# Patient Record
Sex: Female | Born: 1985 | Race: Black or African American | Hispanic: No | Marital: Single | Smoking: Current every day smoker
Health system: Southern US, Community
[De-identification: ages and names within clinical notes are randomized; demographics above are authoritative.]

---
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
## 2006-08-12 ENCOUNTER — Emergency Department: Payer: Self-pay | Admitting: General Practice

## 2008-05-06 ENCOUNTER — Inpatient Hospital Stay: Payer: Self-pay | Admitting: Obstetrics and Gynecology

## 2008-07-20 ENCOUNTER — Emergency Department: Payer: Self-pay | Admitting: Emergency Medicine

## 2008-08-23 ENCOUNTER — Emergency Department: Payer: Self-pay | Admitting: Emergency Medicine

## 2008-08-26 ENCOUNTER — Ambulatory Visit: Payer: Self-pay | Admitting: Emergency Medicine

## 2009-08-08 ENCOUNTER — Encounter: Payer: Self-pay | Admitting: Maternal & Fetal Medicine

## 2009-10-03 ENCOUNTER — Encounter: Payer: Self-pay | Admitting: Obstetrics and Gynecology

## 2009-11-07 ENCOUNTER — Encounter: Payer: Self-pay | Admitting: Maternal and Fetal Medicine

## 2009-12-19 ENCOUNTER — Encounter: Payer: Self-pay | Admitting: Obstetrics and Gynecology

## 2009-12-22 ENCOUNTER — Encounter: Payer: Self-pay | Admitting: Maternal & Fetal Medicine

## 2009-12-26 ENCOUNTER — Observation Stay: Payer: Self-pay | Admitting: Obstetrics and Gynecology

## 2009-12-29 ENCOUNTER — Encounter: Payer: Self-pay | Admitting: Obstetrics and Gynecology

## 2010-01-01 ENCOUNTER — Inpatient Hospital Stay: Payer: Self-pay | Admitting: Obstetrics and Gynecology

## 2010-01-02 ENCOUNTER — Encounter: Payer: Self-pay | Admitting: Maternal & Fetal Medicine

## 2010-01-12 ENCOUNTER — Encounter: Payer: Self-pay | Admitting: Obstetrics and Gynecology

## 2016-11-14 ENCOUNTER — Emergency Department: Payer: PRIVATE HEALTH INSURANCE

## 2016-11-14 ENCOUNTER — Emergency Department
Admission: EM | Admit: 2016-11-14 | Discharge: 2016-11-14 | Disposition: A | Discharge status: Home / Self Care | Payer: PRIVATE HEALTH INSURANCE | Attending: Emergency Medicine | Admitting: Emergency Medicine

## 2016-11-14 ENCOUNTER — Encounter: Payer: Self-pay | Admitting: Medical Oncology

## 2016-11-14 DIAGNOSIS — R300 Dysuria: Secondary | ICD-10-CM

## 2016-11-14 DIAGNOSIS — M549 Dorsalgia, unspecified: Secondary | ICD-10-CM | POA: Diagnosis present

## 2016-11-14 DIAGNOSIS — R319 Hematuria, unspecified: Secondary | ICD-10-CM | POA: Diagnosis not present

## 2016-11-14 DIAGNOSIS — N10 Acute tubulo-interstitial nephritis: Secondary | ICD-10-CM | POA: Insufficient documentation

## 2016-11-14 LAB — URINALYSIS, COMPLETE (UACMP) WITH MICROSCOPIC
Bilirubin Urine: NEGATIVE
GLUCOSE, UA: NEGATIVE mg/dL
Ketones, ur: NEGATIVE mg/dL
NITRITE: NEGATIVE
PROTEIN: NEGATIVE mg/dL
Specific Gravity, Urine: 1.008 (ref 1.005–1.030)
pH: 7 (ref 5.0–8.0)

## 2016-11-14 LAB — COMPREHENSIVE METABOLIC PANEL
ALT: 12 U/L — ABNORMAL LOW (ref 14–54)
AST: 16 U/L (ref 15–41)
Albumin: 3.4 g/dL — ABNORMAL LOW (ref 3.5–5.0)
Alkaline Phosphatase: 58 U/L (ref 38–126)
Anion gap: 6 (ref 5–15)
BILIRUBIN TOTAL: 0.5 mg/dL (ref 0.3–1.2)
BUN: 7 mg/dL (ref 6–20)
CO2: 24 mmol/L (ref 22–32)
CREATININE: 0.65 mg/dL (ref 0.44–1.00)
Calcium: 8.6 mg/dL — ABNORMAL LOW (ref 8.9–10.3)
Chloride: 104 mmol/L (ref 101–111)
GFR calc Af Amer: >60 mL/min (ref >60)
Glucose, Bld: 97 mg/dL (ref 65–99)
POTASSIUM: 3.3 mmol/L — AB (ref 3.5–5.1)
Sodium: 134 mmol/L — ABNORMAL LOW (ref 135–145)
TOTAL PROTEIN: 7.2 g/dL (ref 6.5–8.1)

## 2016-11-14 LAB — CBC WITH DIFFERENTIAL/PLATELET
BASOS ABS: 0 10*3/uL (ref 0–0.1)
BASOS PCT: 0 %
EOS PCT: 0 %
Eosinophils Absolute: 0 10*3/uL (ref 0–0.7)
HCT: 31.1 % — ABNORMAL LOW (ref 35.0–47.0)
Hemoglobin: 10.9 g/dL — ABNORMAL LOW (ref 12.0–16.0)
Lymphocytes Relative: 12 %
Lymphs Abs: 1.9 10*3/uL (ref 1.0–3.6)
MCH: 32.7 pg (ref 26.0–34.0)
MCHC: 35.2 g/dL (ref 32.0–36.0)
MCV: 92.9 fL (ref 80.0–100.0)
Monocytes Absolute: 2 10*3/uL — ABNORMAL HIGH (ref 0.2–0.9)
Monocytes Relative: 13 %
Neutro Abs: 12.3 10*3/uL — ABNORMAL HIGH (ref 1.4–6.5)
Neutrophils Relative %: 75 %
Platelets: 226 10*3/uL (ref 150–440)
RBC: 3.34 MIL/uL — AB (ref 3.80–5.20)
RDW: 12.6 % (ref 11.5–14.5)
WBC: 16.3 10*3/uL — AB (ref 3.6–11.0)

## 2016-11-14 LAB — POCT PREGNANCY, URINE: Preg Test, Ur: NEGATIVE

## 2016-11-14 LAB — LACTIC ACID, PLASMA: Lactic Acid, Venous: 0.6 mmol/L (ref 0.5–1.9)

## 2016-11-14 MED ORDER — CIPROFLOXACIN HCL 500 MG PO TABS: 500.0000 mg | ORAL_TABLET | Freq: Two times a day (BID) | ORAL | 0 refills | Status: AC

## 2016-11-14 MED ORDER — ACETAMINOPHEN 500 MG PO TABS
1000.0000 mg | ORAL_TABLET | Freq: Once | ORAL | Status: AC
  Administered 2016-11-14: 1000 mg via ORAL
  Filled 2016-11-14: qty 2

## 2016-11-14 MED ORDER — CEFTRIAXONE SODIUM IN DEXTROSE 20 MG/ML IV SOLN
1.0000 g | Freq: Once | INTRAVENOUS | Status: AC
  Administered 2016-11-14: 1 g via INTRAVENOUS
  Filled 2016-11-14: qty 50

## 2016-11-14 MED ORDER — SODIUM CHLORIDE 0.9 % IV BOLUS (SEPSIS)
1000.0000 mL | Freq: Once | INTRAVENOUS | Status: AC
  Administered 2016-11-14: 1000 mL via INTRAVENOUS

## 2016-11-14 MED ORDER — HYDROCODONE-ACETAMINOPHEN 5-325 MG PO TABS: ORAL_TABLET | ORAL | 0 refills | Status: AC

## 2016-11-14 NOTE — ED Triage Notes (Addendum)
Pt reports lower back pain, body chills and aches that began Sunday. Pt also reports pain with urinating, feels similar to uti.

## 2016-11-14 NOTE — ED Notes (Signed)
A/o. Pain x 3 days. Denies injury

## 2016-11-14 NOTE — Discharge Instructions (Signed)
Begin taking Cipro twice a day for the next 10 days. Norco if needed for severe pain. Call and make an appointment with Dr. Apolinar JunesBrandon concerning findings seen on your CT scan of the kidneys today. Return to the emergency room if any nausea, vomiting or inability to take antibiotics. Continue to take Tylenol if needed for fever. Increase fluids.

## 2016-11-14 NOTE — ED Notes (Signed)
Pt ambulatory to and from bathroom with steady gait noted

## 2016-11-14 NOTE — ED Provider Notes (Signed)
[facility] Emergency Department Provider Note ____________________________________________  Time seen: 12:14 PM  I have reviewed the triage vital signs and the nursing notes.  HISTORY  Chief Complaint  Back Pain; Generalized Body Aches; and Chills   HPI Courtney Glass is a 31 y.o. female presents to the emergency room with complaint of back pain, body aches, and chills. Patient states that she has had some symptoms of a urinary tract infection and this feels similar to her past UTIs. She states that she has not had a UTI in many years. She denies any nausea or vomiting.Patient reports pain with urination especially to the right groin area. Patient states she was not aware that she had a fever until she arrived in triage. Currently she rates her pain as an 8 out of 10.    History reviewed. No pertinent past medical history.  There are no active problems to display for this patient.   No past surgical history on file.  Prior to Admission medications   Medication Sig Start Date End Date Taking? Authorizing Provider  ciprofloxacin (CIPRO) 500 MG tablet Take 1 tablet (500 mg total) by mouth 2 (two) times daily. 11/14/16   Tommi Rumps, PA-C  HYDROcodone-acetaminophen (NORCO/VICODIN) 5-325 MG tablet 1 tab q 4-6 h prn pain 11/14/16   Tommi Rumps, PA-C    Allergies Patient has no known allergies.  No family history on file.  Social History Social History  Substance Use Topics  . Smoking status: Not on file  . Smokeless tobacco: Not on file  . Alcohol use Not on file    Review of Systems  Constitutional: Positive for chills. Cardiovascular: Negative for chest pain. Respiratory: Negative for shortness of breath. Gastrointestinal: Negative for abdominal pain.   Genitourinary: Positive for dysuria Musculoskeletal: Positive for back pain. Positive for body aches. Skin: Negative for rash. Neurological: Negative for headaches, focal weakness  or numbness. ____________________________________________  PHYSICAL EXAM:  VITAL SIGNS: ED Triage Vitals  Enc Vitals Group     BP 11/14/16 1152 129/85     Pulse Rate 11/14/16 1152 (!) 102     Resp 11/14/16 1152 16     Temp 11/14/16 1152 (!) 102.2 F (39 C)     Temp Source 11/14/16 1152 Oral     SpO2 11/14/16 1152 100 %     Weight --      Height --      Head Circumference --      Peak Flow --      Pain Score 11/14/16 1148 8     Pain Loc --      Pain Edu? --      Excl. in GC? --     Constitutional: Alert and oriented. Well appearing and in no distress. Head: Normocephalic and atraumatic. Neck: Supple.  Cardiovascular: Normal rate, regular rhythm. Normal distal pulses. Respiratory: Normal respiratory effort. No wheezes/rales/rhonchi. Gastrointestinal: Soft and nontender. No distention. Mildly positive CVA tenderness. Musculoskeletal: On examination back there is no gross deformity and no tenderness on palpation of the thoracic or lumbar spine. Patient moves upper and lower extremities without any difficulty. Neurologic:   Normal speech and language. No gross focal neurologic deficits are appreciated. Skin:  Skin is warm, dry and intact. No rash noted. Psychiatric: Mood and affect are normal. Patient exhibits appropriate insight and judgment. ____________________________________________   LABS (pertinent positives/negatives) WBC 16.3, hemoglobin 10.9, hematocrit 31.1 Urinalysis showed trace WBCs with RBCs too numerous to count. ____________________________________________ __________________________________________  RADIOLOGY IMPRESSION:  Right renal findings seen with pyelonephritis. There is also a 23 mm  right renal mass which is high density and may reflect a hemorrhagic  cyst. Recommend imaging follow-up after treatment, could first  attempt sonography. No hydronephrosis or urolithiasis.    INITIAL IMPRESSION / ASSESSMENT AND PLAN / ED COURSE  Patient was given  bolus normal saline 1 L along with Rocephin 1 g IV. Patient continued to deny any nausea or vomiting. Patient rested well while in the emergency department. She is comfortable with the plan of going home on antibiotics. She is to follow-up with the urologist and contact information was given for follow-up of her CT scan. Patient is aware that if she develops nausea and vomiting or inability to take antibiotics she is return to the emergency room immediately. She will also continue taking Tylenol as needed for fever. Patient's temp was 98.7 prior to discharge and patient was ambulatory without any difficulty or assistance.    ____________________________________________  FINAL CLINICAL IMPRESSION(S) / ED DIAGNOSES  Final diagnoses:  Dysuria  Hematuria  Pyelonephritis, acute     Jerel Sardina L, PA-C 05Tommi Rumps/23/18 1555    Sharman CheekStafford, Phillip, MD 11/15/16 [phone]

## 2016-11-16 LAB — URINE CULTURE: Special Requests: NORMAL

## 2020-12-09 ENCOUNTER — Other Ambulatory Visit: Payer: Self-pay

## 2020-12-09 ENCOUNTER — Encounter (HOSPITAL_COMMUNITY): Payer: Self-pay | Admitting: Emergency Medicine

## 2020-12-09 ENCOUNTER — Emergency Department (HOSPITAL_COMMUNITY): Payer: PRIVATE HEALTH INSURANCE

## 2020-12-09 ENCOUNTER — Emergency Department (HOSPITAL_COMMUNITY)
Admission: EM | Admit: 2020-12-09 | Discharge: 2020-12-10 | Disposition: A | Discharge status: Home / Self Care | Payer: PRIVATE HEALTH INSURANCE | Attending: Physician Assistant | Admitting: Physician Assistant

## 2020-12-09 DIAGNOSIS — Z5321 Procedure and treatment not carried out due to patient leaving prior to being seen by health care provider: Secondary | ICD-10-CM | POA: Diagnosis not present

## 2020-12-09 DIAGNOSIS — U071 COVID-19: Secondary | ICD-10-CM | POA: Diagnosis not present

## 2020-12-09 DIAGNOSIS — R0602 Shortness of breath: Secondary | ICD-10-CM | POA: Diagnosis present

## 2020-12-09 LAB — SARS CORONAVIRUS 2 (TAT 6-24 HRS): SARS Coronavirus 2: POSITIVE — AB

## 2020-12-09 MED ORDER — ACETAMINOPHEN 325 MG PO TABS
650.0000 mg | ORAL_TABLET | Freq: Once | ORAL | Status: AC
  Administered 2020-12-09: 13:00:00 650 mg via ORAL
  Filled 2020-12-09: qty 2

## 2020-12-09 NOTE — ED Triage Notes (Signed)
Pt reports feeling generalized weakness that started yesterday that has gotten worse. Pt reports taking home Covid test that resulted positive. Pt also reports chills and loss of taste.

## 2020-12-09 NOTE — ED Notes (Signed)
Pt LWBS, stickers and armband handed to staff.

## 2020-12-09 NOTE — ED Provider Notes (Signed)
Emergency Medicine Provider Triage Evaluation Note  Courtney Glass , a 35 y.o. female  was evaluated in triage.  Pt complains of covid.  She had a positive test at home.  Her friend was sick recently.  She reports body pain and fatigue along with shortness of breath  Review of Systems  Positive: Myalgias Negative: Fever  Physical Exam  BP (!) 134/99 (BP Location: Left Leg)   Pulse 96   Temp 99.8 F (37.7 C) (Oral)   Resp 20   Ht 5\' 3"  (1.6 m)   Wt 79.4 kg   LMP 11/09/2020   SpO2 97%   BMI 31.00 kg/m  Gen:   Awake, no distress   Resp:  Normal effort  MSK:   Moves extremities without difficulty  Other:  Speech is normal.   Medical Decision Making  Medically screening exam initiated at 12:26 PM.  Appropriate orders placed.  JOZEY JANCO was informed that the remainder of the evaluation will be completed by another provider, this initial triage assessment does not replace that evaluation, and the importance of remaining in the ED until their evaluation is complete.     Becky Sax, PA-C 12/09/20 1233    12/11/20, MD 12/09/20 [phone]

## 2021-09-23 ENCOUNTER — Emergency Department (HOSPITAL_BASED_OUTPATIENT_CLINIC_OR_DEPARTMENT_OTHER)
Admission: EM | Admit: 2021-09-23 | Discharge: 2021-09-23 | Disposition: A | Discharge status: Home / Self Care | Payer: PRIVATE HEALTH INSURANCE | Attending: Emergency Medicine | Admitting: Emergency Medicine

## 2021-09-23 ENCOUNTER — Emergency Department (HOSPITAL_BASED_OUTPATIENT_CLINIC_OR_DEPARTMENT_OTHER): Payer: PRIVATE HEALTH INSURANCE

## 2021-09-23 ENCOUNTER — Other Ambulatory Visit: Payer: Self-pay

## 2021-09-23 ENCOUNTER — Encounter (HOSPITAL_BASED_OUTPATIENT_CLINIC_OR_DEPARTMENT_OTHER): Payer: Self-pay | Admitting: Emergency Medicine

## 2021-09-23 DIAGNOSIS — N10 Acute tubulo-interstitial nephritis: Secondary | ICD-10-CM | POA: Insufficient documentation

## 2021-09-23 DIAGNOSIS — N12 Tubulo-interstitial nephritis, not specified as acute or chronic: Secondary | ICD-10-CM | POA: Diagnosis not present

## 2021-09-23 DIAGNOSIS — R109 Unspecified abdominal pain: Secondary | ICD-10-CM | POA: Diagnosis present

## 2021-09-23 LAB — URINALYSIS, ROUTINE W REFLEX MICROSCOPIC
Bilirubin Urine: NEGATIVE
Glucose, UA: NEGATIVE mg/dL
Hgb urine dipstick: NEGATIVE
Ketones, ur: 15 mg/dL — AB
Nitrite: NEGATIVE
Specific Gravity, Urine: 1.01 (ref 1.005–1.030)
pH: 6.5 (ref 5.0–8.0)

## 2021-09-23 LAB — CBC
HCT: 37.7 % (ref 36.0–46.0)
Hemoglobin: 12.9 g/dL (ref 12.0–15.0)
MCH: 33.1 pg (ref 26.0–34.0)
MCHC: 34.2 g/dL (ref 30.0–36.0)
MCV: 96.7 fL (ref 80.0–100.0)
Platelets: 222 10*3/uL (ref 150–400)
RBC: 3.9 MIL/uL (ref 3.87–5.11)
RDW: 12.7 % (ref 11.5–15.5)
WBC: 18.6 10*3/uL — ABNORMAL HIGH (ref 4.0–10.5)
nRBC: 0 % (ref 0.0–0.2)

## 2021-09-23 LAB — HEPATIC FUNCTION PANEL
ALT: 13 U/L (ref 0–44)
AST: 13 U/L — ABNORMAL LOW (ref 15–41)
Albumin: 4.3 g/dL (ref 3.5–5.0)
Alkaline Phosphatase: 71 U/L (ref 38–126)
Bilirubin, Direct: 0.3 mg/dL — ABNORMAL HIGH (ref 0.0–0.2)
Indirect Bilirubin: 0.6 mg/dL (ref 0.3–0.9)
Total Bilirubin: 0.9 mg/dL (ref 0.3–1.2)
Total Protein: 8.2 g/dL — ABNORMAL HIGH (ref 6.5–8.1)

## 2021-09-23 LAB — BASIC METABOLIC PANEL
Anion gap: 11 (ref 5–15)
BUN: 11 mg/dL (ref 6–20)
CO2: 22 mmol/L (ref 22–32)
Calcium: 9.5 mg/dL (ref 8.9–10.3)
Chloride: 100 mmol/L (ref 98–111)
Creatinine, Ser: 0.85 mg/dL (ref 0.44–1.00)
GFR, Estimated: >60 mL/min (ref >60)
Glucose, Bld: 122 mg/dL — ABNORMAL HIGH (ref 70–99)
Potassium: 3.4 mmol/L — ABNORMAL LOW (ref 3.5–5.1)
Sodium: 133 mmol/L — ABNORMAL LOW (ref 135–145)

## 2021-09-23 LAB — LIPASE, BLOOD: Lipase: <10 U/L — ABNORMAL LOW (ref 11–51)

## 2021-09-23 LAB — PREGNANCY, URINE: Preg Test, Ur: NEGATIVE

## 2021-09-23 MED ORDER — IOHEXOL 300 MG/ML  SOLN
100.0000 mL | Freq: Once | INTRAMUSCULAR | Status: AC | PRN
  Administered 2021-09-23: 100 mL via INTRAVENOUS

## 2021-09-23 MED ORDER — SODIUM CHLORIDE 0.9 % IV SOLN
1.0000 g | Freq: Once | INTRAVENOUS | Status: AC
  Administered 2021-09-23: 1 g via INTRAVENOUS
  Filled 2021-09-23: qty 10

## 2021-09-23 MED ORDER — FENTANYL CITRATE PF 50 MCG/ML IJ SOSY
50.0000 ug | PREFILLED_SYRINGE | Freq: Once | INTRAMUSCULAR | Status: AC
  Administered 2021-09-23: 50 ug via INTRAVENOUS
  Filled 2021-09-23: qty 1

## 2021-09-23 MED ORDER — SULFAMETHOXAZOLE-TRIMETHOPRIM 800-160 MG PO TABS: 1.0000 | ORAL_TABLET | Freq: Two times a day (BID) | ORAL | 0 refills | Status: AC

## 2021-09-23 NOTE — Discharge Instructions (Addendum)
You were diagnosed with a kidney infection today. I have prescribed an antibiotic. Please complete the entire prescription. Return to the emergency department if you become unable to tolerate oral liquids or if pain becomes intolerable ?

## 2021-09-23 NOTE — ED Provider Notes (Signed)
?[facility] EMERGENCY DEPT ?Provider Note ? ? ?CSN: [id number] ?Arrival date & time: 09/23/21  1303 ? ?  ? ?History ? ?Chief Complaint  ?Patient presents with  ? Flank Pain  ? ? ?Courtney Glass is a 36 y.o. female.  Patient complains of 2 days of abdominal pain with nausea and occasional vomiting.  Patient states that on Thursday she had a intermittent cheese biscuit aversion was began to have severe pain.  Patient does report fevers at home.  Severe pain.  Denies dysuria or hematuria.  Denies diarrhea or constipation.  Patient states she has history of previous kidney infection and symptoms do feel similar at this time ? ?HPI ? ?  ? ?Home Medications ?Prior to Admission medications   ?Medication Sig Start Date End Date Taking? Authorizing Provider  ?sulfamethoxazole-trimethoprim (BACTRIM DS) 800-160 MG tablet Take 1 tablet by mouth 2 (two) times daily for 7 days. 09/23/21 09/30/21 Yes Darrick Grinder, PA-C  ?ciprofloxacin (CIPRO) 500 MG tablet Take 1 tablet (500 mg total) by mouth 2 (two) times daily. 11/14/16   Tommi Rumps, PA-C  ?HYDROcodone-acetaminophen (NORCO/VICODIN) 5-325 MG tablet 1 tab q 4-6 h prn pain 11/14/16   Tommi Rumps, PA-C  ?   ? ?Allergies    ?Patient has no known allergies.   ? ?Review of Systems   ?Review of Systems  ?Constitutional:  Positive for chills and fever.  ?Respiratory:  Negative for shortness of breath.   ?Cardiovascular:  Negative for chest pain.  ?Gastrointestinal:  Positive for abdominal pain, nausea and vomiting. Negative for constipation and diarrhea.  ?Genitourinary:  Positive for flank pain. Negative for dysuria and hematuria.  ?Skin:  Negative for color change and pallor.  ? ?Physical Exam ?Updated Vital Signs ?BP (!) 134/93 (BP Location: Right Arm)   Pulse 99   Temp 98.7 ?F (37.1 ?C) (Oral)   Resp 16   Ht 5\' 3"  (1.6 m)   Wt 79.4 kg   LMP 09/09/2021 (Approximate)   SpO2 99%   BMI 31.01 kg/m?  ?Physical Exam ?Vitals and nursing note reviewed.   ?Constitutional:   ?   General: She is not in acute distress. ?HENT:  ?   Head: Normocephalic and atraumatic.  ?Eyes:  ?   Conjunctiva/sclera: Conjunctivae normal.  ?Cardiovascular:  ?   Rate and Rhythm: Normal rate and regular rhythm.  ?   Pulses: Normal pulses.  ?   Heart sounds: Normal heart sounds.  ?Pulmonary:  ?   Effort: Pulmonary effort is normal.  ?   Breath sounds: Normal breath sounds.  ?Abdominal:  ?   General: There is no distension.  ?   Palpations: Abdomen is soft.  ?   Tenderness: There is abdominal tenderness (Severe in right upper quadrant and epigastric region). There is right CVA tenderness (Minimal). There is no guarding.  ?Musculoskeletal:  ?   Cervical back: Normal range of motion.  ?Skin: ?   General: Skin is warm and dry.  ?   Coloration: Skin is not jaundiced or pale.  ?Neurological:  ?   Mental Status: She is alert.  ? ? ?ED Results / Procedures / Treatments   ?Labs ?(all labs ordered are listed, but only abnormal results are displayed) ?Labs Reviewed  ?URINALYSIS, ROUTINE W REFLEX MICROSCOPIC - Abnormal; Notable for the following components:  ?    Result Value  ? Ketones, ur 15 (*)   ? Protein, ur TRACE (*)   ? Leukocytes,Ua TRACE (*)   ? Bacteria,  UA FEW (*)   ? All other components within normal limits  ?BASIC METABOLIC PANEL - Abnormal; Notable for the following components:  ? Sodium 133 (*)   ? Potassium 3.4 (*)   ? Glucose, Bld 122 (*)   ? All other components within normal limits  ?CBC - Abnormal; Notable for the following components:  ? WBC 18.6 (*)   ? All other components within normal limits  ?LIPASE, BLOOD - Abnormal; Notable for the following components:  ? Lipase <10 (*)   ? All other components within normal limits  ?HEPATIC FUNCTION PANEL - Abnormal; Notable for the following components:  ? Total Protein 8.2 (*)   ? AST 13 (*)   ? Bilirubin, Direct 0.3 (*)   ? All other components within normal limits  ?PREGNANCY, URINE  ? ? ?EKG ?None ? ?Radiology ?CT Abdomen Pelvis W  Contrast ? ?Result Date: 09/23/2021 ?CLINICAL DATA:  Flank pain, right upper quadrant pain, epigastric pain. EXAM: CT ABDOMEN AND PELVIS WITH CONTRAST TECHNIQUE: Multidetector CT imaging of the abdomen and pelvis was performed using the standard protocol following bolus administration of intravenous contrast. RADIATION DOSE REDUCTION: This exam was performed according to the departmental dose-optimization program which includes automated exposure control, adjustment of the mA and/or kV according to patient size and/or use of iterative reconstruction technique. CONTRAST:  100mL OMNIPAQUE IOHEXOL 300 MG/ML  SOLN COMPARISON:  CT renal stone protocol 11/04/2016 FINDINGS: Lower chest: No acute abnormality. Hepatobiliary: Several small calcifications are scattered throughout the liver, unchanged compared to 11/14/2016 and likely representing sequela of granulomatous disease. No focal liver lesion identified. No gallstones, gallbladder wall thickening, or bile duct dilatation. Pancreas: Unremarkable. No pancreatic ductal dilatation or surrounding inflammatory changes. Spleen: Normal size. Scattered small calcifications throughout the spleen, similar to prior exam and likely representing sequela of granulomatous disease. Adrenals/Urinary Tract: Adrenal glands are unremarkable. Focal hypoenhancement at the upper pole of the right kidney with mild adjacent fat stranding (series 2, image 29). Left kidney is normal. No stone in either renal collecting system or in the urinary bladder. No hydronephrosis. Minimally distended urinary bladder is unremarkable. Stomach/Bowel: Stomach is within normal limits. Appendix appears normal. No evidence of bowel wall thickening, distention, or inflammatory changes. Vascular/Lymphatic: No significant vascular findings. Enlarged retrocaval node measuring 1 cm short axis (series 2, image 27). No other enlarged lymph nodes in the abdomen or pelvis. Reproductive: Uterus and bilateral adnexa are  unremarkable. Other: No abdominal wall hernia or abnormality. No abdominopelvic ascites. Musculoskeletal: No acute or significant osseous findings. IMPRESSION: 1. Focal pyelonephritis in the upper pole of the right kidney. No perinephric fluid collection. 2. No nephrolithiasis or hydronephrosis in either kidney. 3. Enlarged retrocaval node is likely reactive given adjacent right pyelonephritis. 4. Sequela of prior granulomatous disease with small calcified granulomas in the liver and spleen. Electronically Signed   By: Sherron AlesLaura  Parra M.D.   On: 09/23/2021 17:02   ? ?Procedures ?Procedures  ? ? ?Medications Ordered in ED ?Medications  ?cefTRIAXone (ROCEPHIN) 1 g in sodium chloride 0.9 % 100 mL IVPB (has no administration in time range)  ?fentaNYL (SUBLIMAZE) injection 50 mcg (50 mcg Intravenous Given 09/23/21 1629)  ?iohexol (OMNIPAQUE) 300 MG/ML solution 100 mL (100 mLs Intravenous Contrast Given 09/23/21 1636)  ? ? ?ED Course/ Medical Decision Making/ A&P ?  ?                        ?Medical Decision Making ?Amount and/or Complexity of  Data Reviewed ?Labs: ordered. ? ? ?This patient presents to the ED for concern of abdominal pain, this involves an extensive number of treatment options, and is a complaint that carries with it a high risk of complications and morbidity.  The differential diagnosis includes but is not limited to cholangitis, cholecystitis, pancreatitis, pyelonephritis, nephrolithiasis, and others ? ? ?Co morbidities that complicate the patient evaluation ? ?History of previous kidney infection ? ? ?Additional history obtained: ? ?Additional history obtained from N/A ?External records from outside source obtained and reviewed including ED chart from May 2018 ? ? ?Lab Tests: ? ?I Ordered, and personally interpreted labs.  The pertinent results include:  WBC 18.6 ? ? ?Imaging Studies ordered: ? ?I ordered imaging studies including CT abdomen/pelvis  ?I independently visualized and interpreted imaging which  showed acute pyelonephritis right kidney ?I agree with the radiologist interpretation ? ? ?Problem List / ED Course / Critical interventions / Medication management ? ? ?I ordered medication including fentanyl for pain

## 2021-09-23 NOTE — ED Triage Notes (Signed)
Pt via pov from home with right  flank pain x 2 days, as well as chills, increased urgency and frequency of urination. Pt reports hx of kidney infection and says this feels the same. Pt alert & oriented, nad noted.  ?

## 2022-04-05 IMAGING — DX DG CHEST 1V PORT
1 series · 1 of 1 positions shown · non-contrast
Comparison: None.

CLINICAL DATA: Covid, shortness of breath

EXAM:
PORTABLE CHEST 1 VIEW

[chest]
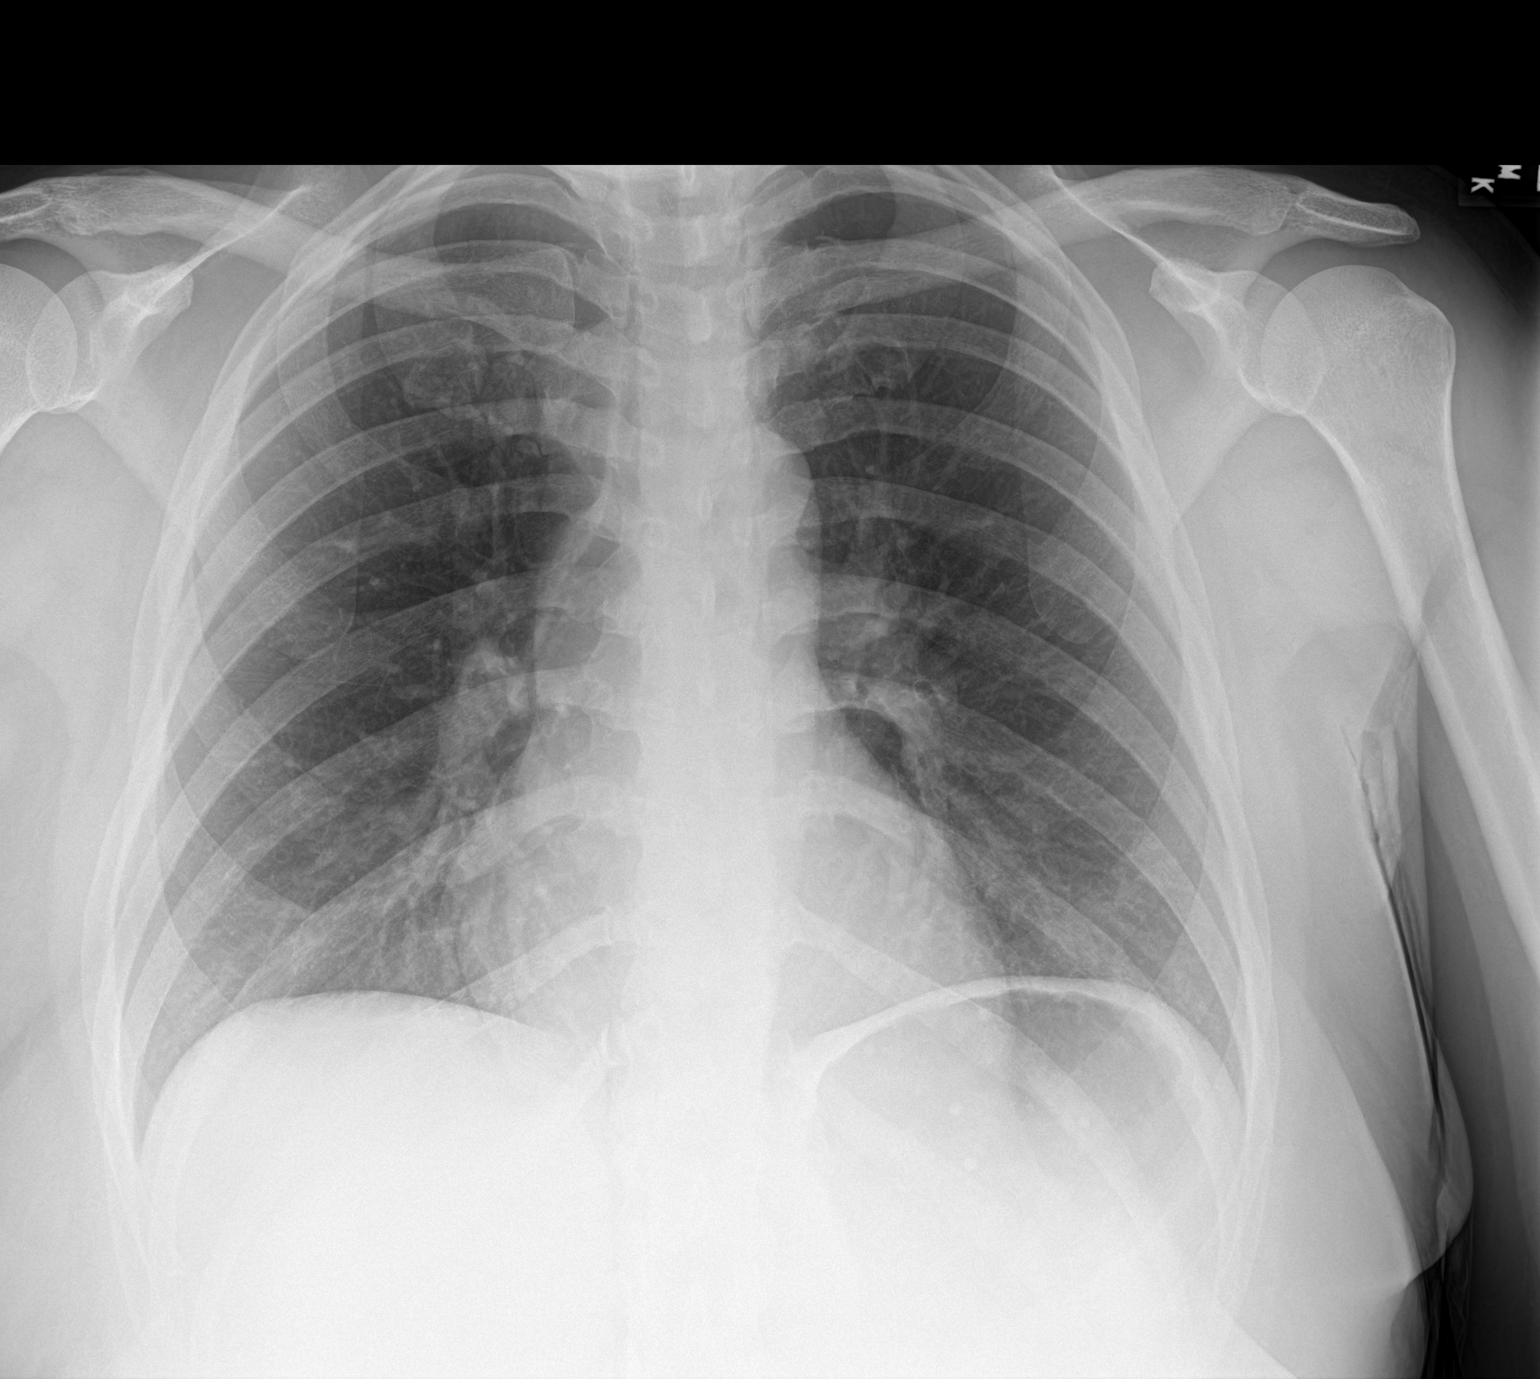

[1 of 1 positions shown; findings below may reference images not displayed]

FINDINGS: The heart size and mediastinal contours are within normal limits.No
focal airspace disease. No pleural effusion or pneumothorax.No acute
osseous abnormality.
IMPRESSION: No evidence of acute cardiopulmonary disease.

## 2022-12-06 ENCOUNTER — Telehealth: Payer: PRIVATE HEALTH INSURANCE | Admitting: Physician Assistant

## 2022-12-06 DIAGNOSIS — B9689 Other specified bacterial agents as the cause of diseases classified elsewhere: Secondary | ICD-10-CM

## 2022-12-06 DIAGNOSIS — J019 Acute sinusitis, unspecified: Secondary | ICD-10-CM | POA: Diagnosis not present

## 2022-12-06 MED ORDER — AMOXICILLIN-POT CLAVULANATE 875-125 MG PO TABS: 1.0000 | ORAL_TABLET | Freq: Two times a day (BID) | ORAL | 0 refills | Status: AC

## 2022-12-06 NOTE — Progress Notes (Signed)
I have spent 5 minutes in review of e-visit questionnaire, review and updating patient chart, medical decision making and response to patient.   Joshuajames Moehring Cody Lance Galas, PA-C    

## 2022-12-06 NOTE — Progress Notes (Signed)

## 2023-01-18 IMAGING — CT CT ABD-PELV W/ CM
2 of 5 series · 16 of 46 positions shown, 18 images · IV contrast (APPLIED)
Comparison: CT renal stone protocol 11/04/2016

CLINICAL DATA: Flank pain, right upper quadrant pain, epigastric
pain.

EXAM:
CT ABDOMEN AND PELVIS WITH CONTRAST
TECHNIQUE: Multidetector CT imaging of the abdomen and pelvis was performed
using the standard protocol following bolus administration of
intravenous contrast.

[Series 2: abd pel w · axial · 0.78mm/px · z∈[-546,-156]mm · 13 of 88 slices shown, 15 images]
[im 5/88  soft-tissue]
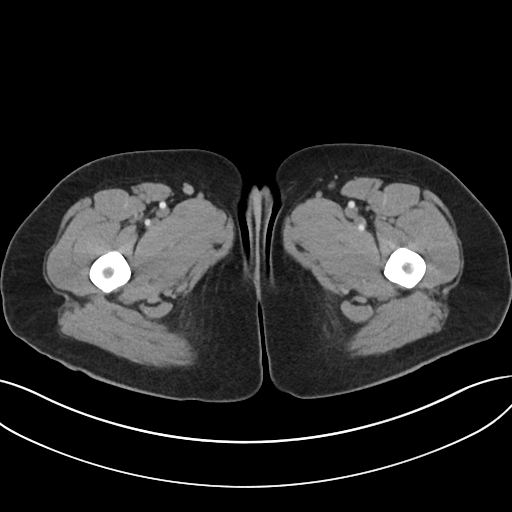
[im 5/88  bone]
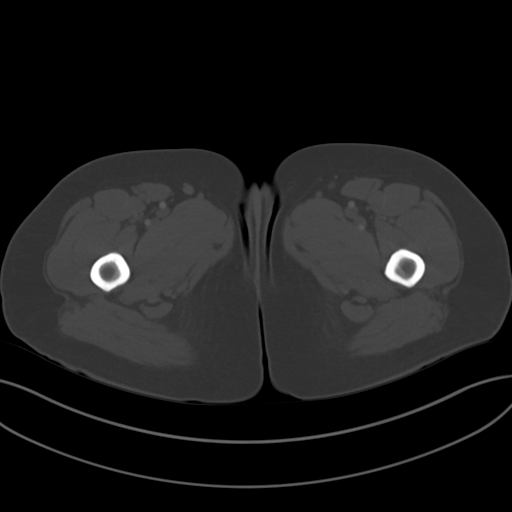
[im 10/88  soft-tissue]
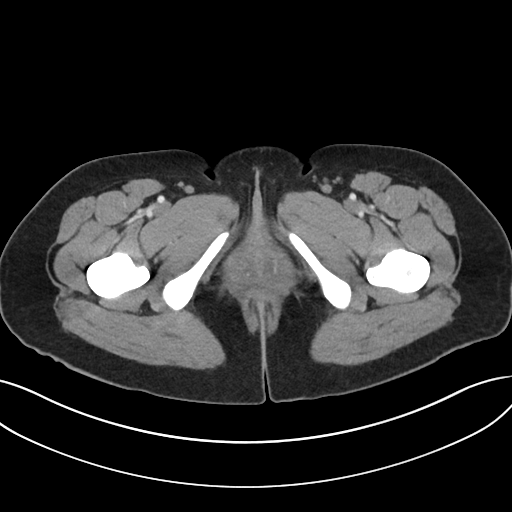
[im 20/88  soft-tissue]
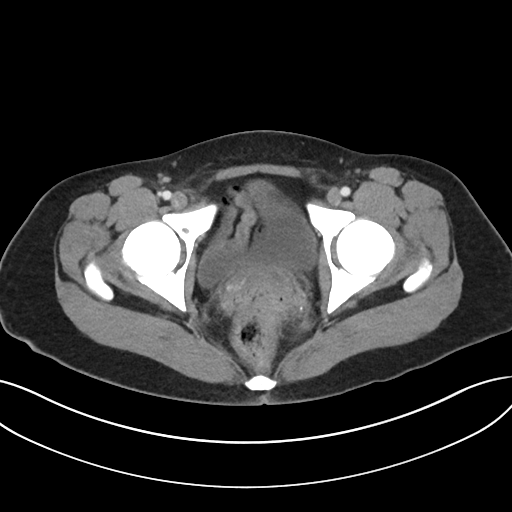
[im 25/88  soft-tissue]
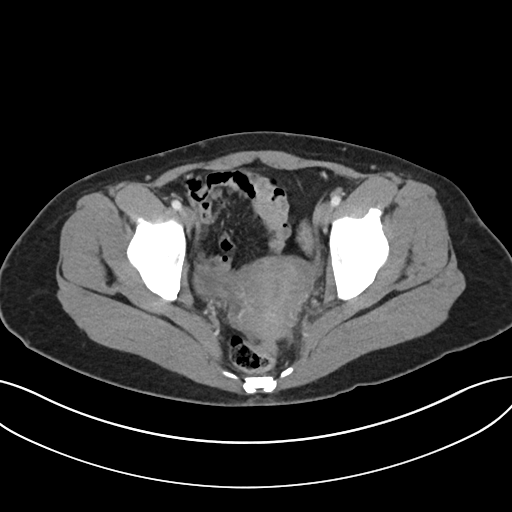
[im 30/88  soft-tissue]
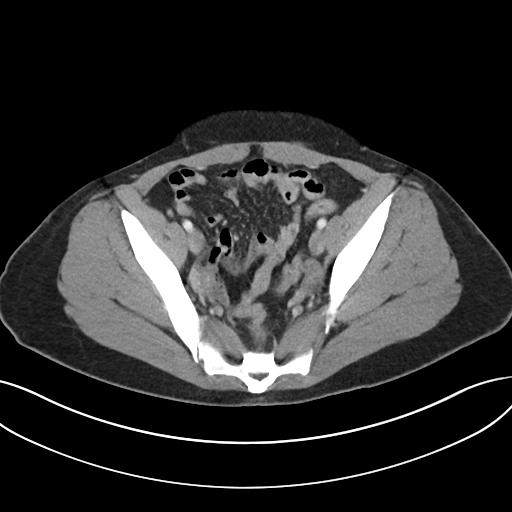
[im 39/88  soft-tissue]
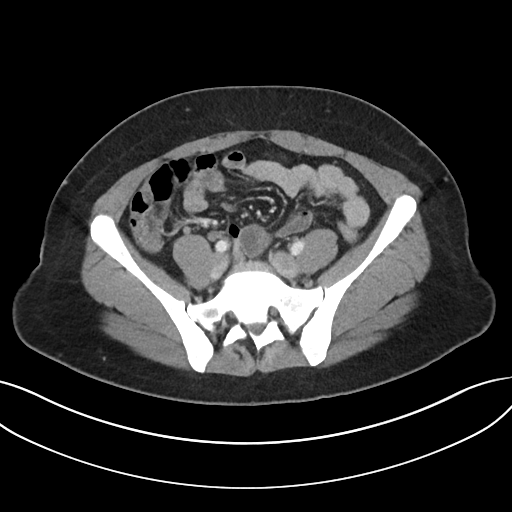
[im 44/88  soft-tissue]
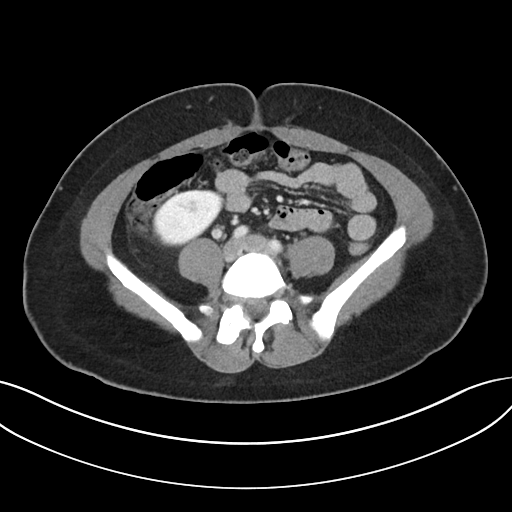
[im 49/88  soft-tissue]
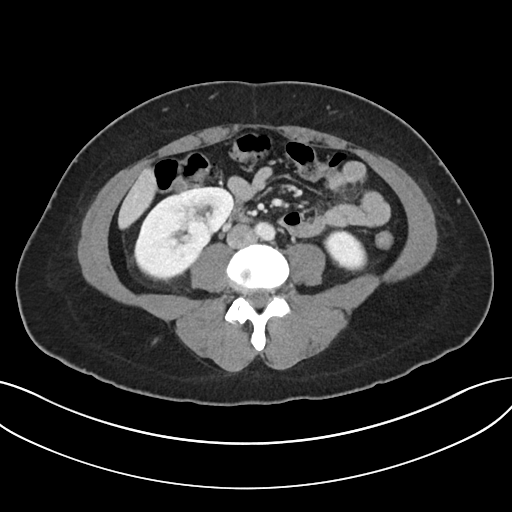
[im 59/88  soft-tissue]
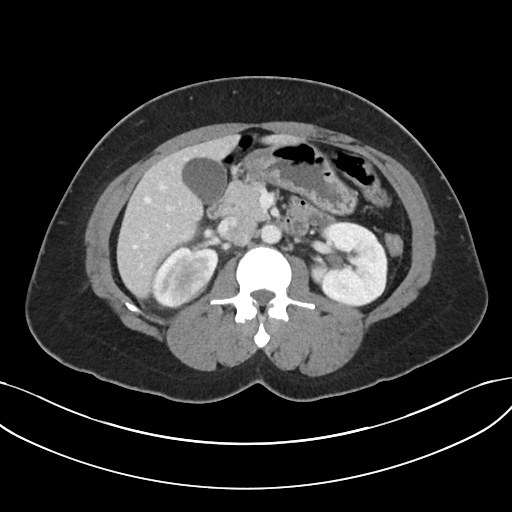
[im 59/88  bone]
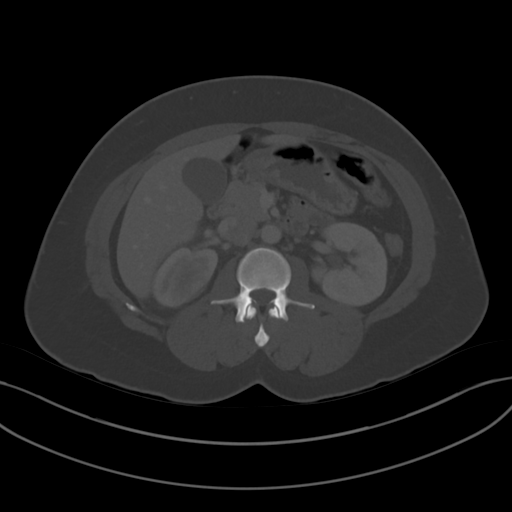
[im 63/88  soft-tissue]
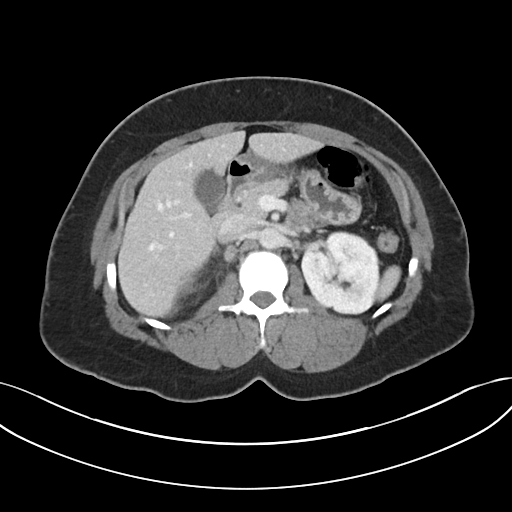
[im 68/88  soft-tissue]
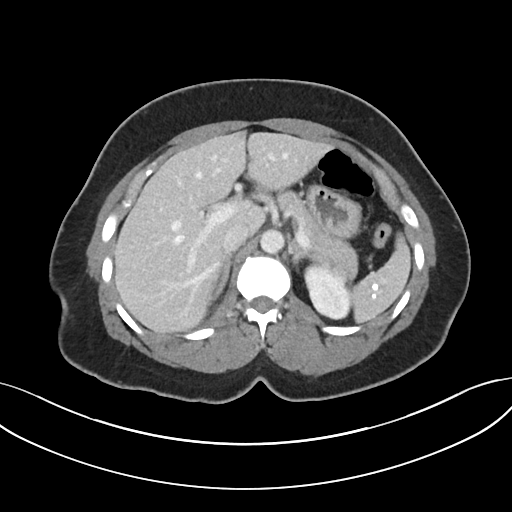
[im 78/88  soft-tissue]
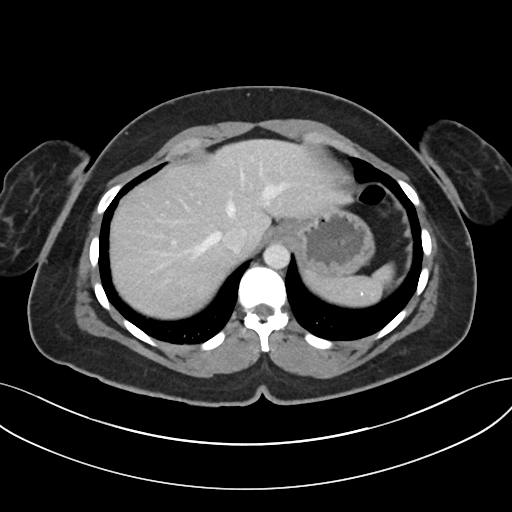
[im 83/88  soft-tissue]
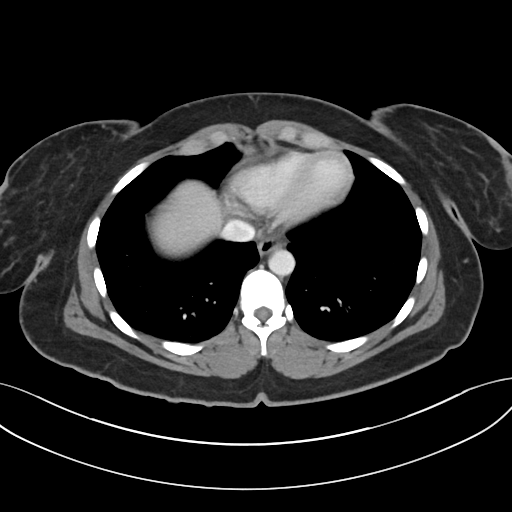

[Series 5: coronal · coronal · 0.86mm/px · 3 of 92 slices shown]
[im 31/92  soft-tissue]
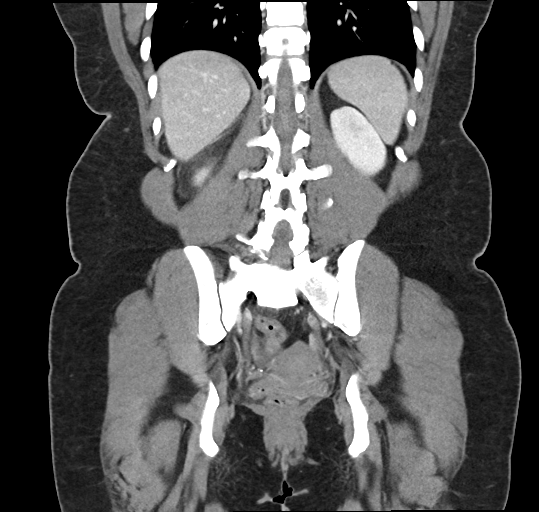
[im 41/92  soft-tissue]
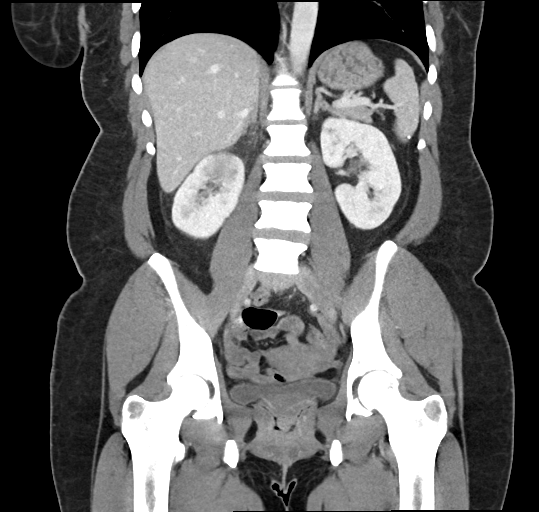
[im 51/92  soft-tissue]
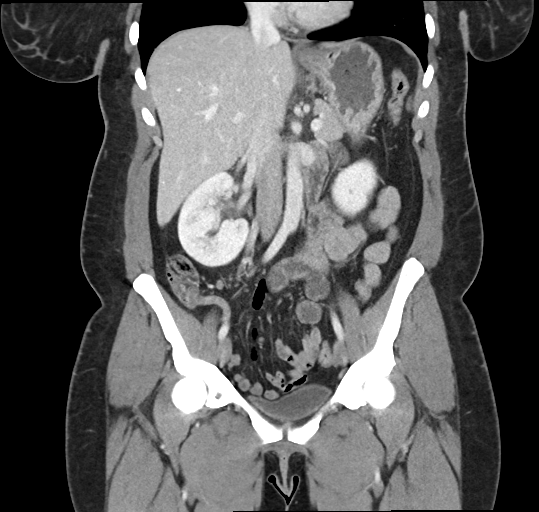

[16 of 46 positions shown; findings below may reference images not displayed]

RADIATION DOSE REDUCTION: This exam was performed according to the
departmental dose-optimization program which includes automated
exposure control, adjustment of the mA and/or kV according to
patient size and/or use of iterative reconstruction technique.

CONTRAST:  100mL OMNIPAQUE IOHEXOL 300 MG/ML  SOLN
FINDINGS: Lower chest: No acute abnormality.

Hepatobiliary: Several small calcifications are scattered throughout
the liver, unchanged compared to 11/14/2016 and likely representing
sequela of granulomatous disease. No focal liver lesion identified.
No gallstones, gallbladder wall thickening, or bile duct dilatation.

Pancreas: Unremarkable. No pancreatic ductal dilatation or
surrounding inflammatory changes.

Spleen: Normal size. Scattered small calcifications throughout the
spleen, similar to prior exam and likely representing sequela of
granulomatous disease.

Adrenals/Urinary Tract: Adrenal glands are unremarkable. Focal
hypoenhancement at the upper pole of the right kidney with mild
adjacent fat stranding (series 2, image 29). Left kidney is normal.
No stone in either renal collecting system or in the urinary
bladder. No hydronephrosis. Minimally distended urinary bladder is
unremarkable.

Stomach/Bowel: Stomach is within normal limits. Appendix appears
normal. No evidence of bowel wall thickening, distention, or
inflammatory changes.

Vascular/Lymphatic: No significant vascular findings. Enlarged
retrocaval node measuring 1 cm short axis (series 2, image 27). No
other enlarged lymph nodes in the abdomen or pelvis.

Reproductive: Uterus and bilateral adnexa are unremarkable.

Other: No abdominal wall hernia or abnormality. No abdominopelvic
ascites.

Musculoskeletal: No acute or significant osseous findings.
IMPRESSION: 1. Focal pyelonephritis in the upper pole of the right kidney. No
perinephric fluid collection.
2. No nephrolithiasis or hydronephrosis in either kidney.
3. Enlarged retrocaval node is likely reactive given adjacent right
pyelonephritis.
4. Sequela of prior granulomatous disease with small calcified
granulomas in the liver and spleen.
